# Patient Record
Sex: Female | Born: 1990 | Race: Black or African American | Hispanic: No | Marital: Single | Smoking: Never smoker
Health system: Southern US, Community
[De-identification: ages and names within clinical notes are randomized; demographics above are authoritative.]

## PROBLEM LIST (undated history)

## (undated) DIAGNOSIS — F419 Anxiety disorder, unspecified: Secondary | ICD-10-CM

## (undated) DIAGNOSIS — J45909 Unspecified asthma, uncomplicated: Secondary | ICD-10-CM

## (undated) DIAGNOSIS — D649 Anemia, unspecified: Secondary | ICD-10-CM

## (undated) DIAGNOSIS — G43909 Migraine, unspecified, not intractable, without status migrainosus: Secondary | ICD-10-CM

## (undated) HISTORY — PX: TONSILLECTOMY: SUR1361

---
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
## 2011-02-27 ENCOUNTER — Emergency Department: Payer: Self-pay | Admitting: Emergency Medicine

## 2014-05-19 ENCOUNTER — Ambulatory Visit: Payer: Self-pay | Admitting: Urology

## 2015-04-17 ENCOUNTER — Other Ambulatory Visit: Payer: Self-pay | Admitting: Internal Medicine

## 2015-04-17 DIAGNOSIS — R1031 Right lower quadrant pain: Secondary | ICD-10-CM

## 2015-04-18 ENCOUNTER — Encounter: Payer: Self-pay | Admitting: Emergency Medicine

## 2015-04-18 ENCOUNTER — Emergency Department
Admission: EM | Admit: 2015-04-18 | Discharge: 2015-04-18 | Disposition: A | Discharge status: Home / Self Care | Payer: Medicaid Other | Attending: Emergency Medicine | Admitting: Emergency Medicine

## 2015-04-18 ENCOUNTER — Emergency Department: Payer: Medicaid Other

## 2015-04-18 DIAGNOSIS — Z3202 Encounter for pregnancy test, result negative: Secondary | ICD-10-CM | POA: Insufficient documentation

## 2015-04-18 DIAGNOSIS — R109 Unspecified abdominal pain: Secondary | ICD-10-CM | POA: Insufficient documentation

## 2015-04-18 HISTORY — DX: Anemia, unspecified: D64.9

## 2015-04-18 HISTORY — DX: Migraine, unspecified, not intractable, without status migrainosus: G43.909

## 2015-04-18 HISTORY — DX: Anxiety disorder, unspecified: F41.9

## 2015-04-18 HISTORY — DX: Unspecified asthma, uncomplicated: J45.909

## 2015-04-18 LAB — URINALYSIS COMPLETE WITH MICROSCOPIC (ARMC ONLY)
BILIRUBIN URINE: NEGATIVE
Glucose, UA: NEGATIVE mg/dL
Hgb urine dipstick: NEGATIVE
Ketones, ur: NEGATIVE mg/dL
NITRITE: NEGATIVE
Protein, ur: NEGATIVE mg/dL
SPECIFIC GRAVITY, URINE: 1.019 (ref 1.005–1.030)
pH: 6 (ref 5.0–8.0)

## 2015-04-18 LAB — CBC WITH DIFFERENTIAL/PLATELET
Basophils Absolute: 0.1 10*3/uL (ref 0–0.1)
Basophils Relative: 1 %
Eosinophils Absolute: 0.2 10*3/uL (ref 0–0.7)
Eosinophils Relative: 2 %
HCT: 40.7 % (ref 35.0–47.0)
Hemoglobin: 12.7 g/dL (ref 12.0–16.0)
LYMPHS ABS: 4 10*3/uL — AB (ref 1.0–3.6)
LYMPHS PCT: 44 %
MCH: 21.4 pg — ABNORMAL LOW (ref 26.0–34.0)
MCHC: 31.1 g/dL — AB (ref 32.0–36.0)
MCV: 68.6 fL — ABNORMAL LOW (ref 80.0–100.0)
Monocytes Absolute: 0.5 10*3/uL (ref 0.2–0.9)
Monocytes Relative: 5 %
NEUTROS PCT: 48 %
Neutro Abs: 4.4 10*3/uL (ref 1.4–6.5)
Platelets: 261 10*3/uL (ref 150–440)
RBC: 5.93 MIL/uL — ABNORMAL HIGH (ref 3.80–5.20)
RDW: 18.7 % — ABNORMAL HIGH (ref 11.5–14.5)
WBC: 9.1 10*3/uL (ref 3.6–11.0)

## 2015-04-18 LAB — POCT PREGNANCY, URINE: Preg Test, Ur: NEGATIVE

## 2015-04-18 LAB — COMPREHENSIVE METABOLIC PANEL
ALT: 13 U/L — AB (ref 14–54)
ANION GAP: 9 (ref 5–15)
AST: 20 U/L (ref 15–41)
Albumin: 3.6 g/dL (ref 3.5–5.0)
Alkaline Phosphatase: 71 U/L (ref 38–126)
BILIRUBIN TOTAL: 0.6 mg/dL (ref 0.3–1.2)
BUN: 8 mg/dL (ref 6–20)
CHLORIDE: 107 mmol/L (ref 101–111)
CO2: 23 mmol/L (ref 22–32)
Calcium: 8.9 mg/dL (ref 8.9–10.3)
Creatinine, Ser: 0.86 mg/dL (ref 0.44–1.00)
GFR calc Af Amer: >60 mL/min (ref >60)
GFR calc non Af Amer: >60 mL/min (ref >60)
GLUCOSE: 107 mg/dL — AB (ref 65–99)
POTASSIUM: 3.9 mmol/L (ref 3.5–5.1)
SODIUM: 139 mmol/L (ref 135–145)
Total Protein: 7.2 g/dL (ref 6.5–8.1)

## 2015-04-18 NOTE — ED Provider Notes (Signed)
[facility] Emergency Department Provider Note    ____________________________________________  Time seen: 1805  I have reviewed the triage vital signs and the nursing notes.   HISTORY  Chief Complaint Abdominal Pain   History limited by: Not Limited   HPI Denise Duffy is a 24 y.o. female who presents to the emergency department because of right sided abdominal pain. Patient has been having this pain for roughly 1.5 months. She has seen her primary care doctor for this pain. She comes in today because pain has gotten worse. She has not noticed any bloody stools although she has had diarrhea. She has had some vomiting. This is nonbloody. Patient denies any fevers. States primary care doctor has scheduled her for an outpatient CT scan. Denies any urinary symptoms.     Past Medical History  Diagnosis Date  . Asthma   . Anxiety   . Migraine   . Anemia     There are no active problems to display for this patient.   Past Surgical History  Procedure Laterality Date  . Tonsillectomy      No current outpatient prescriptions on file.  Allergies Review of patient's allergies indicates no known allergies.  Family History  Problem Relation Age of Onset  . Diabetes Mother   . Hypertension Mother   . GER disease Mother   . Bipolar disorder Mother   . Arthritis Mother   . Diabetes Father   . Hypertension Father     Social History History  Substance Use Topics  . Smoking status: Never Smoker   . Smokeless tobacco: Not on file  . Alcohol Use: No    Review of Systems  Constitutional: Negative for fever. Cardiovascular: Negative for chest pain. Respiratory: Negative for shortness of breath. Gastrointestinal: Positive for right-sided abdominal pain  Genitourinary: Negative for dysuria. Musculoskeletal: Negative for back pain. Skin: Negative for rash. Neurological: Negative for headaches, focal weakness or numbness.   10-point ROS  otherwise negative.  ____________________________________________   PHYSICAL EXAM:  VITAL SIGNS: ED Triage Vitals  Enc Vitals Group     BP 04/18/15 1342 118/60 mmHg     Pulse Rate 04/18/15 1342 90     Resp --      Temp 04/18/15 1342 98.1 F (36.7 C)     Temp Source 04/18/15 1342 Oral     SpO2 04/18/15 1342 97 %     Weight 04/18/15 1342 274 lb (124.286 kg)     Height 04/18/15 1342 5\' 4"  (1.626 m)     Head Cir --      Peak Flow --      Pain Score 04/18/15 1345 7   Constitutional: Alert and oriented. Well appearing and in no distress. Eyes: Conjunctivae are normal. PERRL. Normal extraocular movements. ENT   Head: Normocephalic and atraumatic.   Nose: No congestion/rhinnorhea.   Mouth/Throat: Mucous membranes are moist.   Neck: No stridor. Hematological/Lymphatic/Immunilogical: No cervical lymphadenopathy. Cardiovascular: Normal rate, regular rhythm.  No murmurs, rubs, or gallops. Respiratory: Normal respiratory effort without tachypnea nor retractions. Breath sounds are clear and equal bilaterally. No wheezes/rales/rhonchi. Gastrointestinal: Soft and minimally tender to the right upper quadrant. No rebound. No guarding. Genitourinary: Deferred Musculoskeletal: Normal range of motion in all extremities. No joint effusions.  No lower extremity tenderness nor edema. Neurologic:  Normal speech and language. No gross focal neurologic deficits are appreciated. Speech is normal.  Skin:  Skin is warm, dry and intact. No rash noted. Psychiatric: Mood and affect are normal.  Speech and behavior are normal. Patient exhibits appropriate insight and judgment.  ____________________________________________    LABS (pertinent positives/negatives)  Labs Reviewed  CBC WITH DIFFERENTIAL/PLATELET - Abnormal; Notable for the following:    RBC 5.93 (*)    MCV 68.6 (*)    MCH 21.4 (*)    MCHC 31.1 (*)    RDW 18.7 (*)    Lymphs Abs 4.0 (*)    All other components within normal  limits  COMPREHENSIVE METABOLIC PANEL - Abnormal; Notable for the following:    Glucose, Bld 107 (*)    ALT 13 (*)    All other components within normal limits  URINALYSIS COMPLETEWITH MICROSCOPIC ([facility] ONLY) - Abnormal; Notable for the following:    Color, Urine YELLOW (*)    APPearance HAZY (*)    Leukocytes, UA TRACE (*)    Bacteria, UA MANY (*)    Squamous Epithelial / LPF 6-30 (*)    All other components within normal limits  POC URINE PREG, ED  POCT PREGNANCY, URINE     ____________________________________________   EKG  None  ____________________________________________    RADIOLOGY  Right upper quadrant ultrasound IMPRESSION: Unremarkable ultrasound of the right upper quadrant. ____________________________________________   PROCEDURES  Procedure(s) performed: None  Critical Care performed: No  ____________________________________________   INITIAL IMPRESSION / ASSESSMENT AND PLAN / ED COURSE  Pertinent labs & imaging results that were available during my care of the patient were reviewed by me and considered in my medical decision making (see chart for details).  Patient presents to the emergency department for right-sided abdominal pain for 1.5 months. On exam patient landed tender in the right upper quadrant. Blood work without any leukocytosis. Right upper quadrant ultrasound without any concerning findings. At this point discussed with patient encourage continued primary care evaluation workup. Did not think patient required emergent CT scanning at this point given normal vital signs and lab work.  ____________________________________________   FINAL CLINICAL IMPRESSION(S) / ED DIAGNOSES  Final diagnoses:  Abdominal pain, unspecified abdominal location     Phineas Semen, MD 04/18/15 2016

## 2015-04-18 NOTE — Discharge Instructions (Signed)
Please seek medical attention for any high fevers, chest pain, shortness of breath, change in behavior, persistent vomiting, bloody stool or any other new or concerning symptoms. ° °Abdominal Pain °Many things can cause abdominal pain. Usually, abdominal pain is not caused by a disease and will improve without treatment. It can often be observed and treated at home. Your health care provider will do a physical exam and possibly order blood tests and X-rays to help determine the seriousness of your pain. However, in many cases, more time must pass before a clear cause of the pain can be found. Before that point, your health care provider may not know if you need more testing or further treatment. °HOME CARE INSTRUCTIONS  °Monitor your abdominal pain for any changes. The following actions may help to alleviate any discomfort you are experiencing: °· Only take over-the-counter or prescription medicines as directed by your health care provider. °· Do not take laxatives unless directed to do so by your health care provider. °· Try a clear liquid diet (broth, tea, or water) as directed by your health care provider. Slowly move to a bland diet as tolerated. °SEEK MEDICAL CARE IF: °· You have unexplained abdominal pain. °· You have abdominal pain associated with nausea or diarrhea. °· You have pain when you urinate or have a bowel movement. °· You experience abdominal pain that wakes you in the night. °· You have abdominal pain that is worsened or improved by eating food. °· You have abdominal pain that is worsened with eating fatty foods. °· You have a fever. °SEEK IMMEDIATE MEDICAL CARE IF:  °· Your pain does not go away within 2 hours. °· You keep throwing up (vomiting). °· Your pain is felt only in portions of the abdomen, such as the right side or the left lower portion of the abdomen. °· You pass bloody or black tarry stools. °MAKE SURE YOU: °· Understand these instructions.   °· Will watch your condition.   °· Will  get help right away if you are not doing well or get worse.   °Document Released: 08/10/2005 Document Revised: 11/05/2013 Document Reviewed: 07/10/2013 °Exit[employer]® Patient Information ©2015 [employer]. This information is not intended to replace advice given to you by your health care provider. Make sure you discuss any questions you have with your health care provider. ° °

## 2015-04-18 NOTE — ED Notes (Signed)
Pt to ed with c/o right side abd pain x 1 1/2 months,  Pt states progressively worse over the past 1 1/2 months.  Pt also reports n/v/d x 2 weeks.

## 2015-04-18 NOTE — ED Notes (Signed)
Pt alert and in NAd at time of d/c. Mother agitated with d/c due to "being here for 7 hours aint no one been in here." MD in room at this time.

## 2015-04-23 ENCOUNTER — Ambulatory Visit: Payer: Self-pay

## 2015-04-27 ENCOUNTER — Ambulatory Visit: Admission: RE | Admit: 2015-04-27 | Payer: Medicaid Other | Source: Ambulatory Visit

## 2015-04-29 ENCOUNTER — Ambulatory Visit
Admission: RE | Admit: 2015-04-29 | Discharge: 2015-04-29 | Disposition: A | Discharge status: Home / Self Care | Payer: Medicaid Other | Source: Ambulatory Visit | Attending: Internal Medicine | Admitting: Internal Medicine

## 2015-04-29 DIAGNOSIS — R1031 Right lower quadrant pain: Secondary | ICD-10-CM | POA: Diagnosis not present

## 2015-04-29 MED ORDER — IOHEXOL 350 MG/ML SOLN
100.0000 mL | Freq: Once | INTRAVENOUS | Status: AC | PRN
  Administered 2015-04-29: 100 mL via INTRAVENOUS

## 2016-06-29 ENCOUNTER — Other Ambulatory Visit: Payer: Self-pay | Admitting: Neurology

## 2016-06-29 DIAGNOSIS — R51 Headache: Secondary | ICD-10-CM

## 2016-06-29 DIAGNOSIS — R519 Headache, unspecified: Secondary | ICD-10-CM

## 2016-06-29 DIAGNOSIS — H4710 Unspecified papilledema: Secondary | ICD-10-CM

## 2016-08-09 ENCOUNTER — Ambulatory Visit
Admission: RE | Admit: 2016-08-09 | Discharge: 2016-08-09 | Disposition: A | Discharge status: Home / Self Care | Payer: Medicaid Other | Source: Ambulatory Visit | Attending: Neurology | Admitting: Neurology

## 2016-08-09 DIAGNOSIS — H4710 Unspecified papilledema: Secondary | ICD-10-CM | POA: Insufficient documentation

## 2016-08-09 DIAGNOSIS — R51 Headache: Secondary | ICD-10-CM | POA: Diagnosis not present

## 2016-08-09 DIAGNOSIS — R519 Headache, unspecified: Secondary | ICD-10-CM

## 2016-08-09 DIAGNOSIS — R9089 Other abnormal findings on diagnostic imaging of central nervous system: Secondary | ICD-10-CM | POA: Insufficient documentation

## 2016-08-11 ENCOUNTER — Other Ambulatory Visit: Payer: Self-pay | Admitting: Neurology

## 2016-08-11 DIAGNOSIS — G932 Benign intracranial hypertension: Secondary | ICD-10-CM

## 2016-08-26 ENCOUNTER — Ambulatory Visit: Admission: RE | Admit: 2016-08-26 | Payer: Medicaid Other | Source: Ambulatory Visit

## 2016-09-08 ENCOUNTER — Ambulatory Visit
Admission: RE | Admit: 2016-09-08 | Discharge: 2016-09-08 | Disposition: A | Discharge status: Home / Self Care | Payer: Medicaid Other | Source: Ambulatory Visit | Attending: Neurology | Admitting: Neurology

## 2016-09-08 DIAGNOSIS — G932 Benign intracranial hypertension: Secondary | ICD-10-CM | POA: Diagnosis not present

## 2016-09-08 LAB — HCG, QUANTITATIVE, PREGNANCY: hCG, Beta Chain, Quant, S: <1 m[IU]/mL (ref <5)

## 2016-09-08 MED ORDER — LIDOCAINE HCL (PF) 1 % IJ SOLN
5.0000 mL | Freq: Once | INTRAMUSCULAR | Status: AC
  Administered 2016-09-08: 5 mL
  Filled 2016-09-08: qty 5

## 2016-09-08 NOTE — Discharge Instructions (Signed)
Lumbar Puncture, Care After °Refer to this sheet in the next few weeks. These instructions provide you with information on caring for yourself after your procedure. Your health care provider may also give you more specific instructions. Your treatment has been planned according to current medical practices, but problems sometimes occur. Call your health care provider if you have any problems or questions after your procedure. °WHAT TO EXPECT AFTER THE PROCEDURE °After your procedure, it is typical to have the following sensations: °· Mild discomfort or pain at the insertion site. °· Mild headache that is relieved with pain medicines. °HOME CARE INSTRUCTIONS °· Avoid lifting anything heavier than 10 lb (4.5 kg) for at least 12 hours after the procedure. °· Drink enough fluids to keep your urine clear or pale yellow. °SEEK MEDICAL CARE IF: °· You have fever or chills. °· You have nausea or vomiting. °· You have a headache that lasts for more than 2 days. °SEEK IMMEDIATE MEDICAL CARE IF: °· You have any numbness or tingling in your legs. °· You are unable to control your bowel or bladder. °· You have bleeding or swelling in your back at the insertion site. °· You are dizzy or faint. °  °This information is not intended to replace advice given to you by your health care provider. Make sure you discuss any questions you have with your health care provider. °  °Document Released: 11/05/2013 Document Reviewed: 11/05/2013 °Elsevier Interactive Patient Education ©2016 [employer] ° °

## 2016-09-08 NOTE — Procedures (Signed)
Procedure and associated risks explained to pt and questions answered.  Written witnessed consent obtained.  L2-3 LP performed under sterile technique with 1% xylocaine using 22g spinal needle.   OP 218 mm water 5.5 cc clear CSF collected (not sent for labs and confirmed with Dr. Daisy BlossomPotter's office) CP  162 mm water  No immediate complications.  Post procedure instructions reviewed in detail.

## 2016-09-08 NOTE — Progress Notes (Signed)
Pt has remained clinically stable post lumbar puncture,vitals have remained stable denies complaints of pain nor headache, parents present, up and tolerating well, instructions for discharge given with questions answered.ready for discharge at this time.

## 2017-05-30 IMAGING — US US ABDOMEN LIMITED
1 series · 14 of 25 positions shown · non-contrast
Comparison: None.

CLINICAL DATA: Chronic right upper quadrant abdominal pain, with
subacute onset of nausea, vomiting and diarrhea. Initial encounter.

EXAM:
US ABDOMEN LIMITED - RIGHT UPPER QUADRANT

[Series 1: us abdomen limited · 0.24mm/px · 14 of 48 slices shown]
[im 1/48]
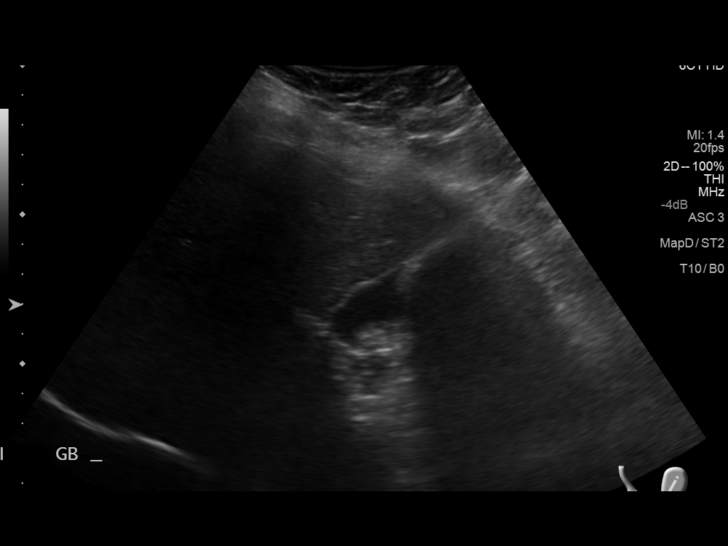
[im 4/48]
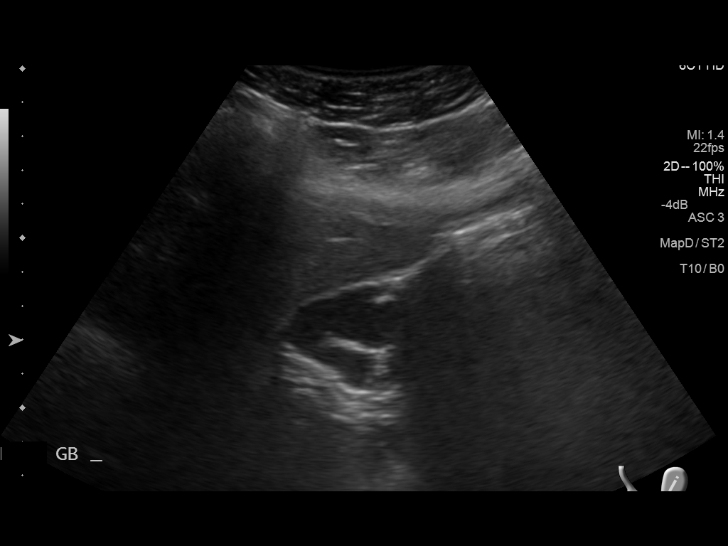
[im 8/48]
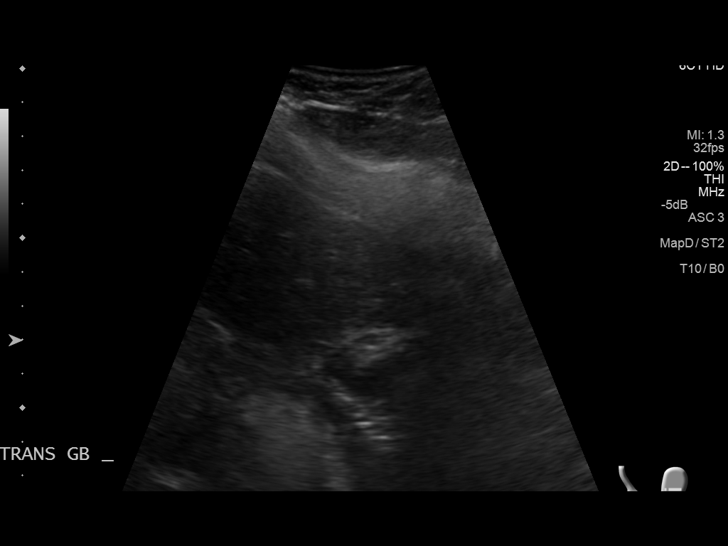
[im 12/48]
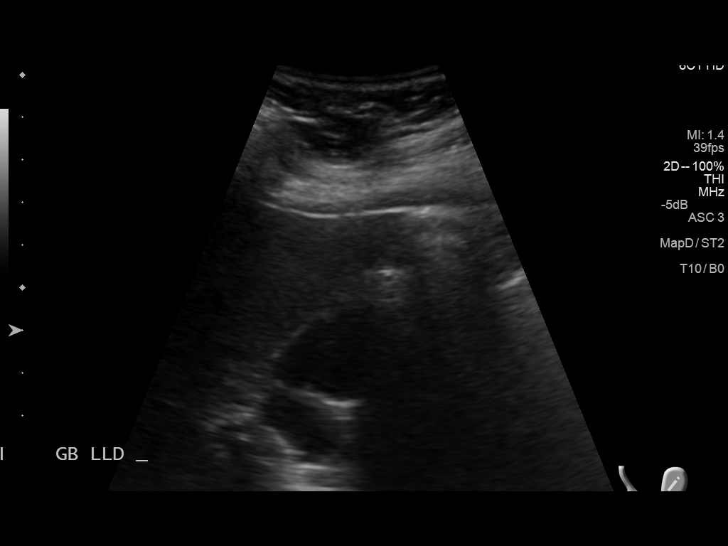
[im 16/48]
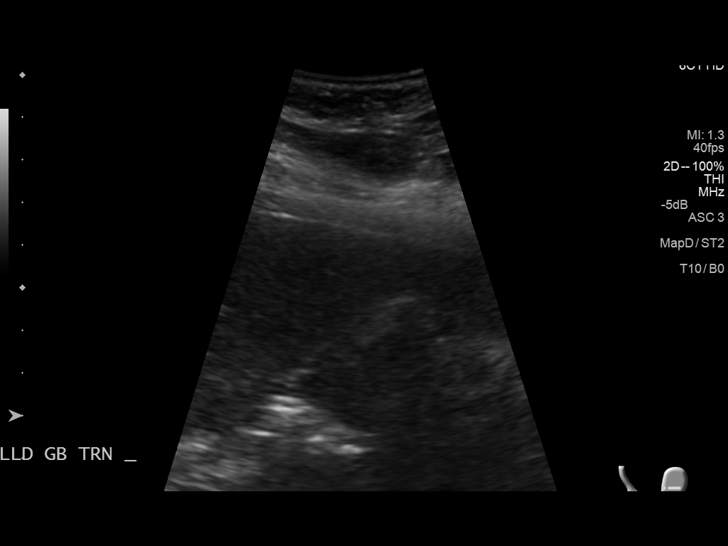
[im 18/48]
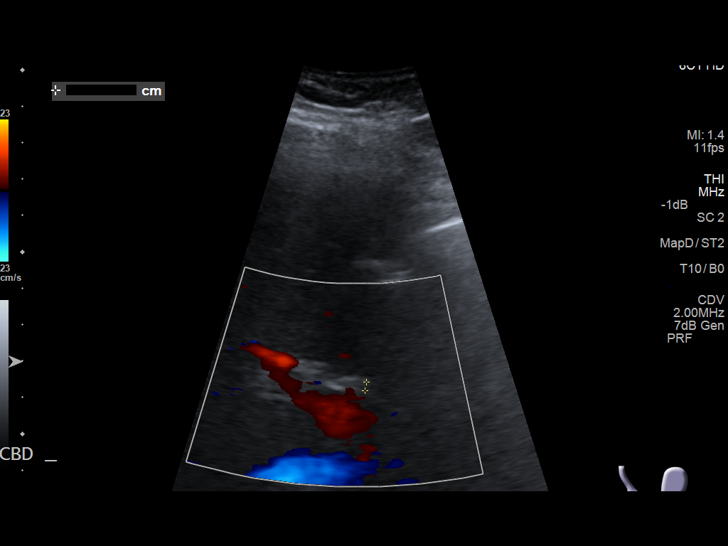
[im 22/48]
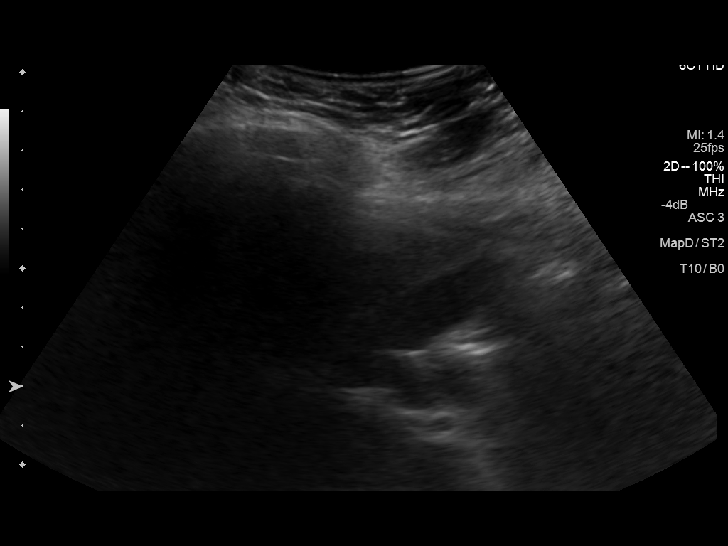
[im 26/48]
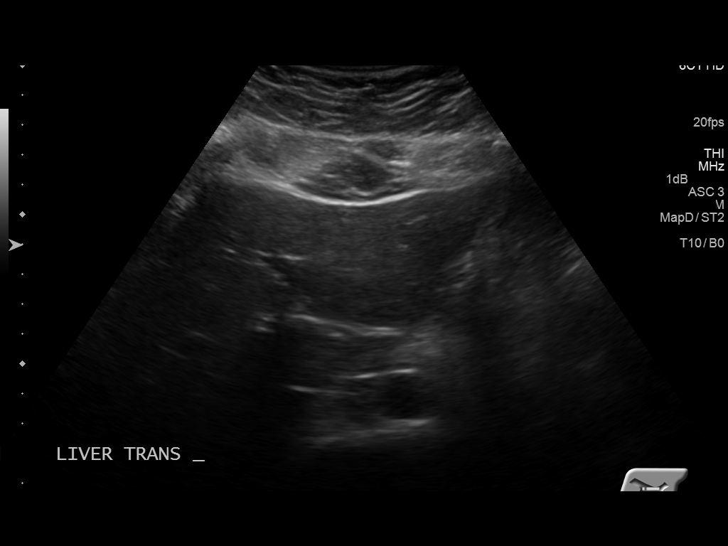
[im 30/48]
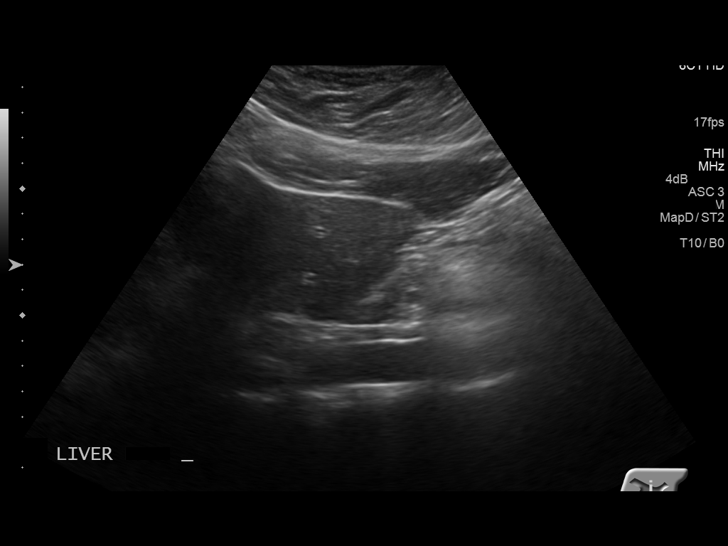
[im 32/48]
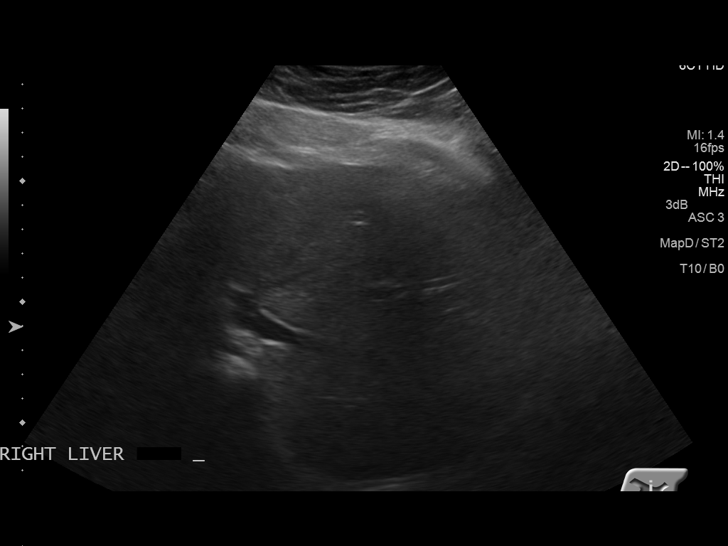
[im 36/48]
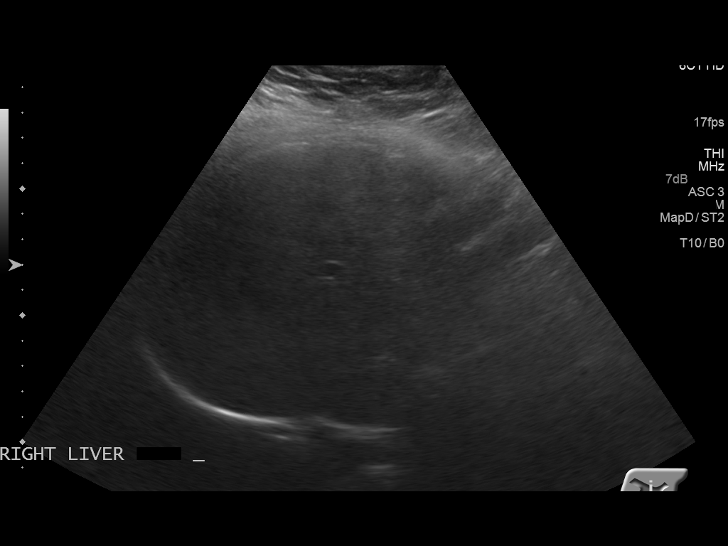
[im 40/48]
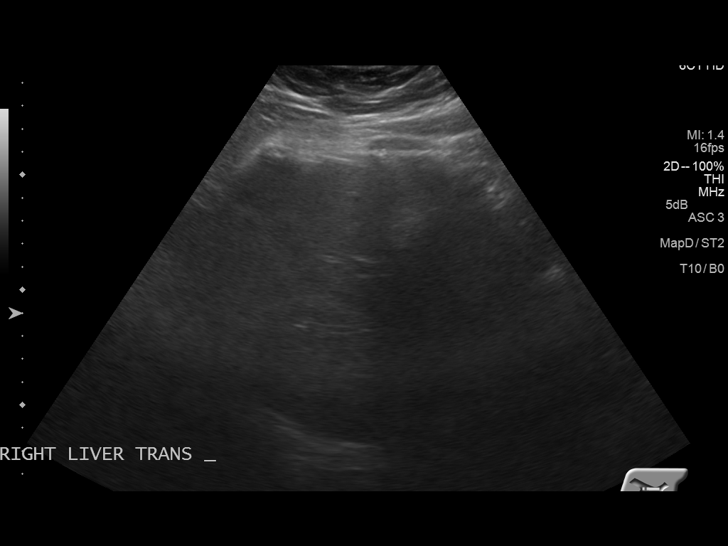
[im 44/48]
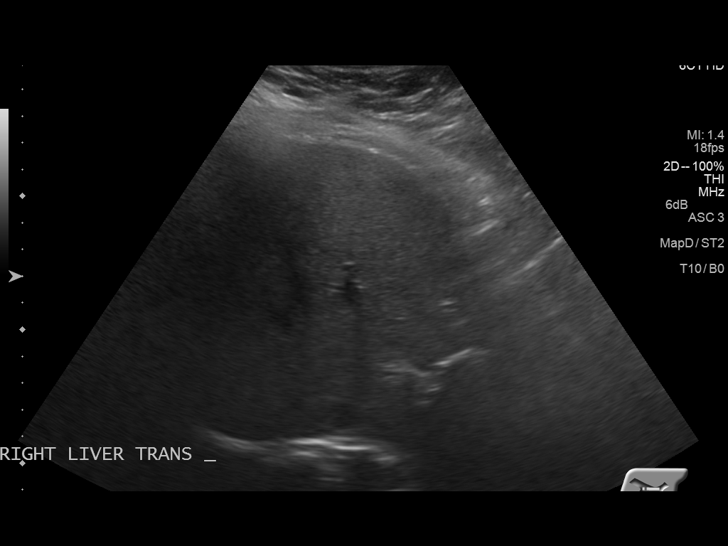
[im 48/48]
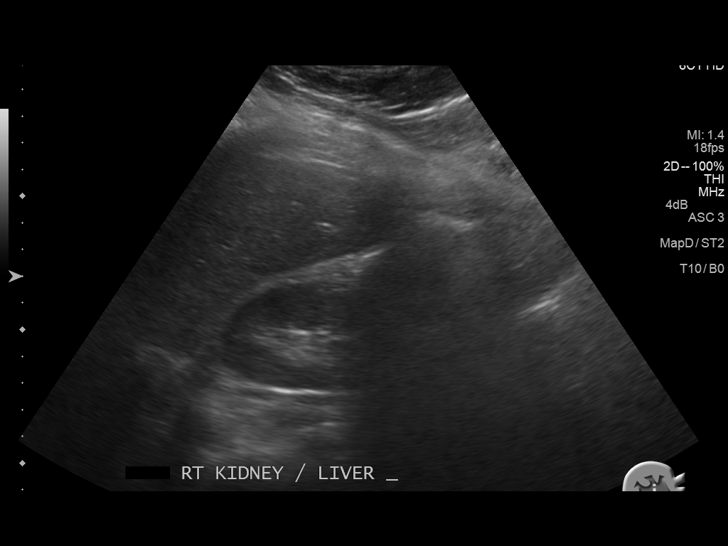

[14 of 25 positions shown; findings below may reference images not displayed]

FINDINGS: Gallbladder:

No gallstones or wall thickening visualized. The gallbladder fundus
is difficult to fully assess, but appears normal following
repositioning. No sonographic Murphy sign noted.

Common bile duct:

Diameter: 0.2 cm within normal limits in caliber.

Liver:

No focal lesion identified. Within normal limits in parenchymal
echogenicity.
IMPRESSION: Unremarkable ultrasound of the right upper quadrant.

## 2020-03-16 ENCOUNTER — Other Ambulatory Visit: Payer: Self-pay

## 2020-03-16 ENCOUNTER — Emergency Department: Payer: Medicaid Other

## 2020-03-16 ENCOUNTER — Encounter: Payer: Self-pay | Admitting: *Deleted

## 2020-03-16 ENCOUNTER — Emergency Department
Admission: EM | Admit: 2020-03-16 | Discharge: 2020-03-16 | Disposition: A | Discharge status: Home / Self Care | Payer: Medicaid Other | Attending: Emergency Medicine | Admitting: Emergency Medicine

## 2020-03-16 DIAGNOSIS — M545 Low back pain, unspecified: Secondary | ICD-10-CM

## 2020-03-16 LAB — URINALYSIS, COMPLETE (UACMP) WITH MICROSCOPIC
Bacteria, UA: NONE SEEN
Bilirubin Urine: NEGATIVE
Glucose, UA: NEGATIVE mg/dL
Hgb urine dipstick: NEGATIVE
Ketones, ur: NEGATIVE mg/dL
Leukocytes,Ua: NEGATIVE
Nitrite: NEGATIVE
Protein, ur: 30 mg/dL — AB
Specific Gravity, Urine: 1.029 (ref 1.005–1.030)
pH: 5 (ref 5.0–8.0)

## 2020-03-16 LAB — POCT PREGNANCY, URINE: Preg Test, Ur: NEGATIVE

## 2020-03-16 MED ORDER — OXYCODONE-ACETAMINOPHEN 5-325 MG PO TABS
1.0000 | ORAL_TABLET | Freq: Once | ORAL | Status: AC
  Administered 2020-03-16: 22:00:00 1 via ORAL
  Filled 2020-03-16: qty 1

## 2020-03-16 MED ORDER — PREDNISONE 10 MG (21) PO TBPK: ORAL_TABLET | ORAL | 0 refills | Status: DC

## 2020-03-16 MED ORDER — CYCLOBENZAPRINE HCL 10 MG PO TABS: 10.0000 mg | ORAL_TABLET | Freq: Three times a day (TID) | ORAL | 0 refills | Status: DC | PRN

## 2020-03-16 MED ORDER — LIDOCAINE 5 % EX PTCH
1.0000 | MEDICATED_PATCH | CUTANEOUS | Status: DC
  Administered 2020-03-16: 23:00:00 1 via TRANSDERMAL
  Filled 2020-03-16: qty 1

## 2020-03-16 MED ORDER — TRAMADOL HCL 50 MG PO TABS: 50.0000 mg | ORAL_TABLET | Freq: Four times a day (QID) | ORAL | 0 refills | Status: DC | PRN

## 2020-03-16 NOTE — ED Triage Notes (Signed)
Pt has lower back pain for several months.  No known injury.  No urinary sx.  Pt alert

## 2020-03-16 NOTE — ED Notes (Signed)
Pt at Xray at this time 

## 2020-03-16 NOTE — ED Provider Notes (Signed)
[facility] Emergency Department Provider Note  ____________________________________________   First MD Initiated Contact with Patient 03/16/20 2059     (approximate)  I have reviewed the triage vital signs and the nursing notes.   HISTORY  Chief Complaint Back Pain    HPI Denise Duffy is a 29 y.o. female presents emergency department with her guardian.   She is complaining of low back pain and itching.  Patient's mother who is her legal guardian states she had a Nexplanon taken out a few months ago has had itching and changes in her body since.  She is complained of low back pain for a while.  Felt it pop today they decided to come to the emergency department.  She is also been evaluated by her regular doctor for back pain.  She denies any fever or chills.  No vaginal discharge.  Patient is not sexually active.   Past Medical History:  Diagnosis Date  . Anemia   . Anxiety   . Asthma   . Migraine     There are no problems to display for this patient.   Past Surgical History:  Procedure Laterality Date  . TONSILLECTOMY      Prior to Admission medications   Medication Sig Start Date End Date Taking? Authorizing Provider  cyclobenzaprine (FLEXERIL) 10 MG tablet Take 1 tablet (10 mg total) by mouth 3 (three) times daily as needed. 03/16/20   Nimrod Wendt, Linden Dolin, PA-C  predniSONE (STERAPRED UNI-PAK 21 TAB) 10 MG (21) TBPK tablet Take 6 pills on day one then decrease by 1 pill each day 03/16/20   Versie Starks, PA-C  traMADol (ULTRAM) 50 MG tablet Take 1 tablet (50 mg total) by mouth every 6 (six) hours as needed. 03/16/20   Versie Starks, PA-C    Allergies Patient has no known allergies.  Family History  Problem Relation Age of Onset  . Diabetes Mother   . Hypertension Mother   . GER disease Mother   . Bipolar disorder Mother   . Arthritis Mother   . Diabetes Father   . Hypertension Father     Social History Social History   Tobacco  Use  . Smoking status: Never Smoker  . Smokeless tobacco: Never Used  Substance Use Topics  . Alcohol use: No  . Drug use: No    Review of Systems  Constitutional: No fever/chills Eyes: No visual changes. ENT: No sore throat. Respiratory: Denies cough Gastrointestinal: Denies abdominal pain Genitourinary: Negative for dysuria. Musculoskeletal: Positive for back pain. Skin: Negative for rash. Psychiatric: no mood changes,     ____________________________________________   PHYSICAL EXAM:  VITAL SIGNS: ED Triage Vitals  Enc Vitals Group     BP 03/16/20 1939 138/76     Pulse Rate 03/16/20 1939 98     Resp 03/16/20 1939 18     Temp 03/16/20 1939 98.1 F (36.7 C)     Temp Source 03/16/20 1939 Oral     SpO2 03/16/20 1939 96 %     Weight 03/16/20 1942 (!) 310 lb (140.6 kg)     Height 03/16/20 1942 5\' 3"  (1.6 m)     Head Circumference --      Peak Flow --      Pain Score 03/16/20 1942 6     Pain Loc --      Pain Edu? --      Excl. in [place]? --     Constitutional: Alert and oriented. Well appearing  and in no acute distress. Eyes: Conjunctivae are normal.  Head: Atraumatic. Nose: No congestion/rhinnorhea. Mouth/Throat: Mucous membranes are moist.   Neck:  supple no lymphadenopathy noted Cardiovascular: Normal rate, regular rhythm.  Respiratory: Normal respiratory effort.  No retractions Abd: soft nontender bs normal all 4 quad GU: deferred Musculoskeletal: Patient has difficulty moving from sitting to standing.  Lumbar spines tender to palpation.  No foot drop is noted.  Neurovascular is intact.  She is able to ambulate to the bathroom after standing.   Neurologic:  Normal speech and language.  Skin:  Skin is warm, dry and intact. No rash noted. Psychiatric: Mood and affect are normal. Speech and behavior are normal.  ____________________________________________   LABS (all labs ordered are listed, but only abnormal results are displayed)  Labs Reviewed   URINALYSIS, COMPLETE (UACMP) WITH MICROSCOPIC - Abnormal; Notable for the following components:      Result Value   Color, Urine YELLOW (*)    APPearance HAZY (*)    Protein, ur 30 (*)    All other components within normal limits  POC URINE PREG, ED  POCT PREGNANCY, URINE   ____________________________________________   ____________________________________________  RADIOLOGY  X-ray of the lumbar spine does not show any acute abnormalities  ____________________________________________   PROCEDURES  Procedure(s) performed: Percocet 1 p.o.   Procedures    ____________________________________________   INITIAL IMPRESSION / ASSESSMENT AND PLAN / ED COURSE  Pertinent labs & imaging results that were available during my care of the patient were reviewed by me and considered in my medical decision making (see chart for details).   Patient is 29 year old female presents emergency department with her legal guardian who is her mother.  Complaining of low back pain.  See HPI  Physical exam shows patient to appear well.  Lumbar spines mildly tender.  Patient has difficulty going from sitting to standing but is able to ambulate without difficulty.  Also no rash/hives are noted on patient's body.  X-ray of the lumbar spine is negative  Patient was given a Percocet while here in the ED.  She states that the pain medication has helped with her pain.  I explained the x-ray results to her and her mother.  We did discuss back pain and follow-up with the correct position if needed.  She is to follow-up with her regular doctor if not improving or she can follow-up with [facility] orthopedics.  They state they understand will comply.  She is to apply ice to the lower back.  Is given prescription for prednisone, Flexeril, and tramadol.     Denise Duffy was evaluated in Emergency Department on 03/16/2020 for the symptoms described in the history of present illness. She was evaluated in  the context of the global COVID-19 pandemic, which necessitated consideration that the patient might be at risk for infection with the SARS-CoV-2 virus that causes COVID-19. Institutional protocols and algorithms that pertain to the evaluation of patients at risk for COVID-19 are in a state of rapid change based on information released by regulatory bodies including the CDC and federal and state organizations. These policies and algorithms were followed during the patient's care in the ED.   As part of my medical decision making, I reviewed the following data within the electronic MEDICAL RECORD NUMBER History obtained from family, Nursing notes reviewed and incorporated, Old chart reviewed, Radiograph reviewed , Notes from prior ED visits and [place] Controlled Substance Database  ____________________________________________   FINAL CLINICAL IMPRESSION(S) / ED DIAGNOSES  Final diagnoses:  Acute midline low back pain without sciatica      NEW MEDICATIONS STARTED DURING THIS VISIT:  New Prescriptions   CYCLOBENZAPRINE (FLEXERIL) 10 MG TABLET    Take 1 tablet (10 mg total) by mouth 3 (three) times daily as needed.   PREDNISONE (STERAPRED UNI-PAK 21 TAB) 10 MG (21) TBPK TABLET    Take 6 pills on day one then decrease by 1 pill each day   TRAMADOL (ULTRAM) 50 MG TABLET    Take 1 tablet (50 mg total) by mouth every 6 (six) hours as needed.     Note:  This document was prepared using Dragon voice recognition software and may include unintentional dictation errors.    Faythe Ghee, PA-C 03/16/20 2300    Sharyn Creamer, MD 03/17/20 [place] Manners

## 2020-03-16 NOTE — ED Notes (Signed)
Pt denies being able to provide urine sample at this time

## 2020-03-16 NOTE — ED Notes (Signed)
Legal guardian/mother at bedside

## 2020-03-16 NOTE — ED Notes (Signed)
See triage note, pt reports lower back pain since February. Pt states she felt her " back pop" earlier today. Used heating pad today with no relief.  Reports taking gabapentin prescribed by PCP for back pain.

## 2020-03-16 NOTE — Discharge Instructions (Addendum)
Follow-up with your regular doctor or Dr. Rosita Kea if not improving in 3 to 5 days.  Use medication as prescribed.  Apply ice to the lower back.  Return if worsening

## 2020-07-27 ENCOUNTER — Other Ambulatory Visit: Payer: Self-pay | Admitting: Neurology

## 2020-07-27 DIAGNOSIS — R519 Headache, unspecified: Secondary | ICD-10-CM

## 2020-07-27 DIAGNOSIS — H4710 Unspecified papilledema: Secondary | ICD-10-CM

## 2020-07-27 DIAGNOSIS — G932 Benign intracranial hypertension: Secondary | ICD-10-CM

## 2020-07-31 ENCOUNTER — Other Ambulatory Visit: Payer: Self-pay | Admitting: Neurology

## 2020-07-31 DIAGNOSIS — G932 Benign intracranial hypertension: Secondary | ICD-10-CM

## 2020-08-03 ENCOUNTER — Ambulatory Visit
Admission: RE | Admit: 2020-08-03 | Discharge: 2020-08-03 | Disposition: A | Discharge status: Home / Self Care | Payer: Medicaid Other | Source: Ambulatory Visit | Attending: Neurology | Admitting: Neurology

## 2020-08-03 ENCOUNTER — Ambulatory Visit: Payer: Medicaid Other

## 2020-08-18 ENCOUNTER — Other Ambulatory Visit: Payer: Self-pay

## 2020-08-18 ENCOUNTER — Ambulatory Visit
Admission: RE | Admit: 2020-08-18 | Discharge: 2020-08-18 | Disposition: A | Discharge status: Home / Self Care | Payer: Medicaid Other | Source: Ambulatory Visit | Attending: Neurology | Admitting: Neurology

## 2020-08-18 DIAGNOSIS — G932 Benign intracranial hypertension: Secondary | ICD-10-CM | POA: Insufficient documentation

## 2020-11-30 ENCOUNTER — Emergency Department: Payer: Medicaid Other

## 2020-11-30 ENCOUNTER — Other Ambulatory Visit: Payer: Self-pay

## 2020-11-30 DIAGNOSIS — J1282 Pneumonia due to coronavirus disease 2019: Secondary | ICD-10-CM | POA: Diagnosis not present

## 2020-11-30 DIAGNOSIS — U071 COVID-19: Secondary | ICD-10-CM | POA: Diagnosis not present

## 2020-11-30 DIAGNOSIS — J45909 Unspecified asthma, uncomplicated: Secondary | ICD-10-CM | POA: Diagnosis not present

## 2020-11-30 DIAGNOSIS — R059 Cough, unspecified: Secondary | ICD-10-CM | POA: Diagnosis present

## 2020-11-30 DIAGNOSIS — R000 Tachycardia, unspecified: Secondary | ICD-10-CM | POA: Insufficient documentation

## 2020-11-30 LAB — CBC WITH DIFFERENTIAL/PLATELET
Abs Immature Granulocytes: 0.02 10*3/uL (ref 0.00–0.07)
Basophils Absolute: 0 10*3/uL (ref 0.0–0.1)
Basophils Relative: 0 %
Eosinophils Absolute: 0 10*3/uL (ref 0.0–0.5)
Eosinophils Relative: 0 %
HCT: 47.2 % — ABNORMAL HIGH (ref 36.0–46.0)
Hemoglobin: 13.6 g/dL (ref 12.0–15.0)
Immature Granulocytes: 0 %
Lymphocytes Relative: 36 %
Lymphs Abs: 2.4 10*3/uL (ref 0.7–4.0)
MCH: 19.5 pg — ABNORMAL LOW (ref 26.0–34.0)
MCHC: 28.8 g/dL — ABNORMAL LOW (ref 30.0–36.0)
MCV: 67.5 fL — ABNORMAL LOW (ref 80.0–100.0)
Monocytes Absolute: 0.4 10*3/uL (ref 0.1–1.0)
Monocytes Relative: 7 %
Neutro Abs: 3.7 10*3/uL (ref 1.7–7.7)
Neutrophils Relative %: 57 %
Platelets: 293 10*3/uL (ref 150–400)
RBC: 6.99 MIL/uL — ABNORMAL HIGH (ref 3.87–5.11)
RDW: 22.1 % — ABNORMAL HIGH (ref 11.5–15.5)
Smear Review: NORMAL
WBC: 6.6 10*3/uL (ref 4.0–10.5)
nRBC: 0 % (ref 0.0–0.2)

## 2020-11-30 LAB — LACTIC ACID, PLASMA: Lactic Acid, Venous: 1.8 mmol/L (ref 0.5–1.9)

## 2020-11-30 LAB — COMPREHENSIVE METABOLIC PANEL
ALT: 16 U/L (ref 0–44)
AST: 27 U/L (ref 15–41)
Albumin: 3.4 g/dL — ABNORMAL LOW (ref 3.5–5.0)
Alkaline Phosphatase: 86 U/L (ref 38–126)
Anion gap: 7 (ref 5–15)
BUN: 8 mg/dL (ref 6–20)
CO2: 25 mmol/L (ref 22–32)
Calcium: 8.1 mg/dL — ABNORMAL LOW (ref 8.9–10.3)
Chloride: 104 mmol/L (ref 98–111)
Creatinine, Ser: 1.04 mg/dL — ABNORMAL HIGH (ref 0.44–1.00)
GFR, Estimated: >60 mL/min (ref >60)
Glucose, Bld: 116 mg/dL — ABNORMAL HIGH (ref 70–99)
Potassium: 4.4 mmol/L (ref 3.5–5.1)
Sodium: 136 mmol/L (ref 135–145)
Total Bilirubin: 0.7 mg/dL (ref 0.3–1.2)
Total Protein: 7.9 g/dL (ref 6.5–8.1)

## 2020-11-30 LAB — TROPONIN I (HIGH SENSITIVITY)
Troponin I (High Sensitivity): 6 ng/L (ref <18)
Troponin I (High Sensitivity): 6 ng/L (ref <18)

## 2020-11-30 MED ORDER — ACETAMINOPHEN 325 MG PO TABS
650.0000 mg | ORAL_TABLET | Freq: Once | ORAL | Status: AC | PRN
  Administered 2020-11-30: 650 mg via ORAL
  Filled 2020-11-30: qty 2

## 2020-11-30 NOTE — ED Triage Notes (Signed)
Pt to ED via [facility] with c/o being Covid+, per EMS pt tested + on 1/11, 91% on RA, 95% on 2L. Per EMS pt cough/sob, chest tenderness with couch.   138/94 128 HR 100 Oral

## 2020-11-30 NOTE — ED Triage Notes (Signed)
Pt to ED via [facility] from home for chief complaint of chest pain, shob and COVID + 1/11 Speaking in complete sentences.  Denies pain at this time, reports intermittent centralizes cp Denies taking any meds for fever today

## 2020-11-30 NOTE — ED Notes (Signed)
Pt mother/legal guardian contacted by this RN and notified of pt arrival to ED

## 2020-12-01 ENCOUNTER — Emergency Department
Admission: EM | Admit: 2020-12-01 | Discharge: 2020-12-01 | Disposition: A | Discharge status: Home / Self Care | Payer: Medicaid Other | Attending: Emergency Medicine | Admitting: Emergency Medicine

## 2020-12-01 ENCOUNTER — Emergency Department: Payer: Medicaid Other

## 2020-12-01 ENCOUNTER — Encounter: Payer: Self-pay | Admitting: Radiology

## 2020-12-01 DIAGNOSIS — J1282 Pneumonia due to coronavirus disease 2019: Secondary | ICD-10-CM

## 2020-12-01 DIAGNOSIS — U071 COVID-19: Secondary | ICD-10-CM

## 2020-12-01 MED ORDER — SODIUM CHLORIDE 0.9 % IV BOLUS
1000.0000 mL | Freq: Once | INTRAVENOUS | Status: AC
  Administered 2020-12-01: 1000 mL via INTRAVENOUS

## 2020-12-01 MED ORDER — KETOROLAC TROMETHAMINE 30 MG/ML IJ SOLN
10.0000 mg | Freq: Once | INTRAMUSCULAR | Status: AC
  Administered 2020-12-01: 9.9 mg via INTRAVENOUS
  Filled 2020-12-01: qty 1

## 2020-12-01 MED ORDER — IOHEXOL 350 MG/ML SOLN
100.0000 mL | Freq: Once | INTRAVENOUS | Status: AC | PRN
  Administered 2020-12-01: 100 mL via INTRAVENOUS
  Filled 2020-12-01: qty 100

## 2020-12-01 MED ORDER — PREDNISONE 50 MG PO TABS: ORAL_TABLET | ORAL | 0 refills | Status: DC

## 2020-12-01 MED ORDER — ONDANSETRON HCL 4 MG/2ML IJ SOLN
4.0000 mg | Freq: Once | INTRAMUSCULAR | Status: AC
  Administered 2020-12-01: 4 mg via INTRAVENOUS
  Filled 2020-12-01: qty 2

## 2020-12-01 MED ORDER — HYDROCOD POLST-CPM POLST ER 10-8 MG/5ML PO SUER
5.0000 mL | Freq: Two times a day (BID) | ORAL | 0 refills | Status: DC
Start: 2020-12-01 — End: 2022-06-30

## 2020-12-01 MED ORDER — AZITHROMYCIN 500 MG PO TABS
500.0000 mg | ORAL_TABLET | Freq: Once | ORAL | Status: AC
  Administered 2020-12-01: 500 mg via ORAL
  Filled 2020-12-01: qty 1

## 2020-12-01 MED ORDER — PREDNISONE 20 MG PO TABS
50.0000 mg | ORAL_TABLET | Freq: Once | ORAL | Status: AC
  Administered 2020-12-01: 50 mg via ORAL
  Filled 2020-12-01: qty 3

## 2020-12-01 MED ORDER — AZITHROMYCIN 250 MG PO TABS: 250.0000 mg | ORAL_TABLET | Freq: Every day | ORAL | 0 refills | Status: DC

## 2020-12-01 NOTE — ED Notes (Signed)
This RN discussed discharge paperwork with patient and patient's mother in private area in lobby, pin-pad for e-signature is unavailable. Pt and mother verbalized understanding of instructions.

## 2020-12-01 NOTE — ED Provider Notes (Signed)
[facility] Emergency Department Provider Note   ____________________________________________   Event Date/Time   First MD Initiated Contact with Patient 12/01/20 [id number]     (approximate)  I have reviewed the triage vital signs and the nursing notes.   HISTORY  Chief Complaint Chest Pain and covid+    HPI Denise Duffy is a 30 y.o. female who presents to the ED from home with a chief complaint of cough, body aches, shortness of breath, chest tightness, posttussive emesis, occasional diarrhea.  Patient tested positive for COVID-19 on 11/24/2020.  She is unvaccinated against COVID-19.  Initially had fevers which she states has improved.  Continues with the above symptoms.  Denies abdominal pain, dysuria, dizziness. States she had a 5-day course of Prednisone last week which she does not feel like helped anything and has an Albuterol inhaler as well as nebulizer.     Past Medical History:  Diagnosis Date  . Anemia   . Anxiety   . Asthma   . Migraine     There are no problems to display for this patient.   Past Surgical History:  Procedure Laterality Date  . TONSILLECTOMY      Prior to Admission medications   Medication Sig Start Date End Date Taking? Authorizing Provider  azithromycin (ZITHROMAX) 250 MG tablet Take 1 tablet (250 mg total) by mouth daily. 12/01/20  Yes Irean Hong, MD  chlorpheniramine-HYDROcodone [facility] PENNKINETIC ER) 10-8 MG/5ML SUER Take 5 mLs by mouth 2 (two) times daily. 12/01/20  Yes Irean Hong, MD  predniSONE (DELTASONE) 50 MG tablet 1 tablet daily until finished 12/01/20  Yes Irean Hong, MD  cyclobenzaprine (FLEXERIL) 10 MG tablet Take 1 tablet (10 mg total) by mouth 3 (three) times daily as needed. 03/16/20   Fisher, Roselyn Bering, PA-C  traMADol (ULTRAM) 50 MG tablet Take 1 tablet (50 mg total) by mouth every 6 (six) hours as needed. 03/16/20   Faythe Ghee, PA-C    Allergies Patient has no known allergies.  Family  History  Problem Relation Age of Onset  . Diabetes Mother   . Hypertension Mother   . GER disease Mother   . Bipolar disorder Mother   . Arthritis Mother   . Diabetes Father   . Hypertension Father     Social History Social History   Tobacco Use  . Smoking status: Never Smoker  . Smokeless tobacco: Never Used  Substance Use Topics  . Alcohol use: No  . Drug use: No    Review of Systems  Constitutional: Positive for body aches.  No fever/chills Eyes: No visual changes. ENT: No sore throat. Cardiovascular: Positive for chest tightness.Marland Kitchen Respiratory: Positive for cough and shortness of breath. Gastrointestinal: No abdominal pain.  Positive for occasional vomiting and diarrhea.  No constipation. Genitourinary: Negative for dysuria. Musculoskeletal: Negative for back pain. Skin: Negative for rash. Neurological: Negative for headaches, focal weakness or numbness.   ____________________________________________   PHYSICAL EXAM:  VITAL SIGNS: ED Triage Vitals  Enc Vitals Group     BP 11/30/20 1541 103/67     Pulse Rate 11/30/20 1541 (!) 128     Resp 11/30/20 1541 (!) 22     Temp 11/30/20 1541 (!) 100.5 F (38.1 C)     Temp Source 11/30/20 1541 Oral     SpO2 11/30/20 1541 94 %     Weight 11/30/20 1543 (!) 320 lb (145.2 kg)     Height 11/30/20 1543 5\' 3"  (1.6 m)  Head Circumference --      Peak Flow --      Pain Score 11/30/20 1542 0     Pain Loc --      Pain Edu? --      Excl. in GC? --     Constitutional: Alert and oriented. Well appearing and in mild acute distress. Eyes: Conjunctivae are normal. PERRL. EOMI. Head: Atraumatic. Nose: Congestion/rhinnorhea. Mouth/Throat: Mucous membranes are mildly dry.   Neck: No stridor.   Cardiovascular: Tachycardic rate, regular rhythm. Grossly normal heart sounds.  Good peripheral circulation. Respiratory: Increased respiratory effort.  No retractions. Lungs mildly diminished bibasilarly. Gastrointestinal: Obese.   Soft and nontender to light or deep palpation. No distention. No abdominal bruits. No CVA tenderness. Musculoskeletal: No lower extremity tenderness nor edema.  No joint effusions. Neurologic:  Normal speech and language. No gross focal neurologic deficits are appreciated.  Skin:  Skin is warm, dry and intact. No rash noted.  No petechiae. Psychiatric: Mood and affect are normal. Speech and behavior are normal.  ____________________________________________   LABS (all labs ordered are listed, but only abnormal results are displayed)  Labs Reviewed  COMPREHENSIVE METABOLIC PANEL - Abnormal; Notable for the following components:      Result Value   Glucose, Bld 116 (*)    Creatinine, Ser 1.04 (*)    Calcium 8.1 (*)    Albumin 3.4 (*)    All other components within normal limits  CBC WITH DIFFERENTIAL/PLATELET - Abnormal; Notable for the following components:   RBC 6.99 (*)    HCT 47.2 (*)    MCV 67.5 (*)    MCH 19.5 (*)    MCHC 28.8 (*)    RDW 22.1 (*)    All other components within normal limits  LACTIC ACID, PLASMA  URINALYSIS, COMPLETE (UACMP) WITH MICROSCOPIC  POC URINE PREG, ED  TROPONIN I (HIGH SENSITIVITY)  TROPONIN I (HIGH SENSITIVITY)   ____________________________________________  EKG  ED ECG REPORT I, Daegan Arizmendi J, the attending physician, personally viewed and interpreted this ECG.   Date: 12/01/2020  EKG Time: 1541  Rate: 124  Rhythm: sinus tachycardia  Axis: Normal  Intervals:none  ST&T Change: Nonspecific  ____________________________________________  RADIOLOGY I, Leathia Farnell J, personally viewed and evaluated these images (plain radiographs) as part of my medical decision making, as well as reviewing the written report by the radiologist.  ED MD interpretation: COVID-19 pneumonia; no PE  Official radiology report(s): DG Chest 2 View  Result Date: 11/30/2020 CLINICAL DATA:  Shortness of breath COVID EXAM: CHEST - 2 VIEW COMPARISON:  None. FINDINGS:  Streaky bilateral lower lung opacity with patchy peripheral right chest opacity. No pleural effusion. Normal heart size. No pneumothorax IMPRESSION: Bilateral airspace opacities suspicious for bilateral pneumonia. Electronically Signed   By: Jasmine Pang M.D.   On: 11/30/2020 16:11   CT Angio Chest PE W/Cm &/Or Wo Cm  Result Date: 12/01/2020 CLINICAL DATA:  30 year old female with chest pain, shortness of breath. Positive for COVID-19 on January 11th. EXAM: CT ANGIOGRAPHY CHEST WITH CONTRAST TECHNIQUE: Multidetector CT imaging of the chest was performed using the standard protocol during bolus administration of intravenous contrast. Multiplanar CT image reconstructions and MIPs were obtained to evaluate the vascular anatomy. CONTRAST:  OMNIPAQUE IOHEXOL 350 MG/ML SOLN COMPARISON:  Chest radiographs 11/30/2020. CT Abdomen and Pelvis 04/29/2015. FINDINGS: Cardiovascular: Suboptimal contrast bolus timing in the pulmonary arterial tree. And there is superimposed respiratory motion artifact which degrades lower lobe branch detail in particular. There is no central  or hilar pulmonary artery filling defect identified. Lower lung segmental branches are not well evaluated, a especially in the middle lobes. But in the visible branches there is no convincing pulmonary artery thrombus. Negative visible aorta. Cardiac size within normal limits. No pericardial effusion. Mediastinum/Nodes: Small reactive appearing hilar lymph nodes more so the right. Hypodense right thyroid nodule estimated at 2.3 cm. Recommend thyroid US. Note: No imaging follow-up is recommended for patients who have limited life expectancy or significant co-morbidities, unless clinically warranted. This recommendation does not apply to patients with symptomatic thyroid disease or increased risk for thyroid cancer. Reference: Earlyne Iba ESPQZR 0076 AUQ;33(3): 143-50 Lungs/Pleura: Major airways are patent. Widely scattered nodular and confluent  peribronchial opacity, mostly sub solid ground-glass opacity. Most confluent involvement in both lower lobes, where there is also a degree of superimposed enhancing lung atelectasis. No pleural effusion. No pneumothorax. Upper Abdomen: Hepatic steatosis. Negative visible gallbladder. Early splenic enhancement is within normal limits. Negative visible pancreas, adrenal glands and bowel. Musculoskeletal: Age advanced lower thoracic disc and endplate degeneration. No acute osseous abnormality identified. Review of the MIP images confirms the above findings. IMPRESSION: 1. Suboptimal contrast bolus timing and respiratory motion limits the exam. No pulmonary embolus is identified. 2. Widespread bilateral lung opacity typical for COVID-19 pneumonia. No pneumothorax or complicating features. 3. Hepatic steatosis. 4. A 2.3 cm right thyroid nodule meets consensus criteria for routine outpatient follow-up. Recommend Thyroid Ultrasound. Reference: Alba Destine Coll Radiol 5456 YBW;38(9): 143-50. Electronically Signed   By: Odessa Fleming M.D.   On: 12/01/2020 06:00    ____________________________________________   PROCEDURES  Procedure(s) performed (including Critical Care):  Procedures   ____________________________________________   INITIAL IMPRESSION / ASSESSMENT AND PLAN / ED COURSE  As part of my medical decision making, I reviewed the following data within the electronic MEDICAL RECORD NUMBER Nursing notes reviewed and incorporated, Labs reviewed, EKG interpreted, Old chart reviewed, Radiograph reviewed and Notes from prior ED visits     30 year old COVID-positive female presenting with body aches, cough, shortness of breath, posttussive emesis, diarrhea. Differential includes, but is not limited to, viral syndrome, bronchitis including COPD exacerbation, pneumonia, reactive airway disease including asthma, CHF including exacerbation with or without pulmonary/interstitial edema, pneumothorax, ACS, thoracic trauma, and  pulmonary embolism.  Laboratory results unremarkable including 2 sets of negative troponins. Patient is tachycardic; will repeat temperature, perform ambulatory trial.  Clinical Course as of 12/01/20 0649  Tue Dec 01, 2020  0454 Patient afebrile. Heart rate improving. Will initiate IV fluid hydration, prednisone, azithromycin, Toradol for body aches, Zofran for nausea. Will obtain CTA chest to evaluate for PE. Perform ambulatory trial and room air saturations maintained at 94%. She was not extremely tachypneic nor tachycardic. [JS]  [id number] Updated patient on CT scan results.  She is feeling significantly better.  Tachycardia better.  Will discharge home on prednisone, azithromycin, Tussionex.  She already has a albuterol inhaler.  Will refer patient to outpatient COVID treatment center for possible treatment.  Strict return precautions given.  Patient verbalizes understanding agrees with plan of care. [JS]    Clinical Course User Index [JS] Irean Hong, MD     ____________________________________________   FINAL CLINICAL IMPRESSION(S) / ED DIAGNOSES  Final diagnoses:  Pneumonia due to COVID-19 virus     ED Discharge Orders         Ordered    predniSONE (DELTASONE) 50 MG tablet        12/01/20 3734    chlorpheniramine-HYDROcodone (TUSSIONEX PENNKINETIC ER)  10-8 MG/5ML SUER  2 times daily        12/01/20 0638    azithromycin (ZITHROMAX) 250 MG tablet  Daily        12/01/20 [id number]    Ambulatory referral for Covid Treatment        12/01/20 [id number]          *Please note:  Athalene S Dorinda HillDonald was evaluated in Emergency Department on 12/01/2020 for the symptoms described in the history of present illness. She was evaluated in the context of the global COVID-19 pandemic, which necessitated consideration that the patient might be at risk for infection with the SARS-CoV-2 virus that causes COVID-19. Institutional protocols and algorithms that pertain to the evaluation of patients at risk for  COVID-19 are in a state of rapid change based on information released by regulatory bodies including the CDC and federal and state organizations. These policies and algorithms were followed during the patient's care in the ED.  Some ED evaluations and interventions may be delayed as a result of limited staffing during and the pandemic.*   Note:  This document was prepared using Dragon voice recognition software and may include unintentional dictation errors.   Irean HongSung, Nakyiah Kuck J, MD 12/01/20 [phone]

## 2020-12-01 NOTE — Discharge Instructions (Signed)
Take steroid and antibiotic daily for the next 4 days: Prednisone 50 mg Azithromycin 250 mg You may take cough medicine as needed (Tussionex). Use your albuterol inhaler 2 puffs every 4 hours as needed for difficulty breathing. Return to the ER for worsening symptoms, persistent vomiting, difficulty breathing or other concerns.

## 2020-12-01 NOTE — ED Notes (Signed)
This RN ambulated pt with pulse oximetry, O2 saturation remained 92%-94% on room air. Dolores Frame MD made aware.

## 2021-01-01 ENCOUNTER — Other Ambulatory Visit: Payer: Self-pay | Admitting: Family Medicine

## 2021-01-01 DIAGNOSIS — E041 Nontoxic single thyroid nodule: Secondary | ICD-10-CM

## 2021-01-13 ENCOUNTER — Other Ambulatory Visit: Payer: Self-pay

## 2021-01-13 ENCOUNTER — Ambulatory Visit
Admission: RE | Admit: 2021-01-13 | Discharge: 2021-01-13 | Disposition: A | Discharge status: Home / Self Care | Payer: Medicaid Other | Source: Ambulatory Visit | Attending: Family Medicine | Admitting: Family Medicine

## 2021-01-13 DIAGNOSIS — E041 Nontoxic single thyroid nodule: Secondary | ICD-10-CM | POA: Diagnosis not present

## 2021-06-14 NOTE — Progress Notes (Signed)
Patient on schedule for LP 06/17/2021, called and spoke with patient on phone with pre procedure instructions given. Made aware to be here @0830 , and driver post procedure/recovery/discharge,aware to have mother/legal guardian with her for consent of procedure. Stated understanding.

## 2021-06-17 ENCOUNTER — Other Ambulatory Visit: Payer: Self-pay

## 2021-06-17 ENCOUNTER — Ambulatory Visit
Admission: RE | Admit: 2021-06-17 | Discharge: 2021-06-17 | Disposition: A | Discharge status: Home / Self Care | Payer: Medicaid Other | Source: Ambulatory Visit | Attending: Neurology | Admitting: Neurology

## 2021-06-17 DIAGNOSIS — G932 Benign intracranial hypertension: Secondary | ICD-10-CM | POA: Diagnosis present

## 2021-06-17 DIAGNOSIS — R519 Headache, unspecified: Secondary | ICD-10-CM | POA: Diagnosis present

## 2021-06-17 DIAGNOSIS — H4710 Unspecified papilledema: Secondary | ICD-10-CM | POA: Insufficient documentation

## 2021-06-17 LAB — PREGNANCY, URINE: Preg Test, Ur: NEGATIVE

## 2021-06-17 MED ORDER — ACETAMINOPHEN 500 MG PO TABS
500.0000 mg | ORAL_TABLET | Freq: Four times a day (QID) | ORAL | Status: DC | PRN
  Filled 2021-06-17: qty 1

## 2021-06-17 MED ORDER — LIDOCAINE HCL 1 % IJ SOLN
5.0000 mL | Freq: Once | INTRAMUSCULAR | Status: DC
  Filled 2021-06-17: qty 5

## 2021-06-17 MED ORDER — ACETAMINOPHEN 500 MG PO TABS
ORAL_TABLET | ORAL | Status: AC
  Administered 2021-06-17: 500 mg
  Filled 2021-06-17: qty 1

## 2021-06-17 NOTE — Progress Notes (Signed)
Pt stable after LP.Back stable. D/C instructions given.F/U with her MD.

## 2021-06-17 NOTE — Progress Notes (Signed)
Patient here for procedure there is an active fyi on chart patient has legal guardian but does not indicate who the legal guardian is nor is there any scanned documentation of any legal guardianship in her chart.  I see that in the past the patient has signed her consent forms.  I asked the patient and her mother about this and I was told the patient is unable to read/write.  I asked about the prior consent forms and the mother said she signs the daughters name on the consents.  I explained that the consent form could be read to her and she could make a mark on the form.  The mother said she signs for her and I explained legally the mother would sign her name and indicate her relationship to the patient and also the reason for her signing on the consents.  I also asked the mother to bring in documentation of her legal guardianship so that this could be noted in the patient's chart.

## 2021-11-23 ENCOUNTER — Other Ambulatory Visit: Payer: Self-pay | Admitting: Otolaryngology

## 2021-11-23 DIAGNOSIS — H90A32 Mixed conductive and sensorineural hearing loss, unilateral, left ear with restricted hearing on the contralateral side: Secondary | ICD-10-CM

## 2021-12-14 ENCOUNTER — Ambulatory Visit: Payer: Medicaid Other

## 2021-12-17 ENCOUNTER — Ambulatory Visit
Admission: RE | Admit: 2021-12-17 | Discharge: 2021-12-17 | Disposition: A | Discharge status: Home / Self Care | Payer: Medicaid Other | Source: Ambulatory Visit | Attending: Otolaryngology | Admitting: Otolaryngology

## 2021-12-17 DIAGNOSIS — H90A32 Mixed conductive and sensorineural hearing loss, unilateral, left ear with restricted hearing on the contralateral side: Secondary | ICD-10-CM | POA: Insufficient documentation

## 2022-06-30 ENCOUNTER — Emergency Department
Admission: EM | Admit: 2022-06-30 | Discharge: 2022-06-30 | Disposition: A | Discharge status: Home / Self Care | Payer: Medicaid Other | Attending: Emergency Medicine | Admitting: Emergency Medicine

## 2022-06-30 ENCOUNTER — Emergency Department: Payer: Medicaid Other

## 2022-06-30 ENCOUNTER — Other Ambulatory Visit: Payer: Self-pay

## 2022-06-30 DIAGNOSIS — M5432 Sciatica, left side: Secondary | ICD-10-CM | POA: Diagnosis not present

## 2022-06-30 DIAGNOSIS — M545 Low back pain, unspecified: Secondary | ICD-10-CM | POA: Diagnosis present

## 2022-06-30 LAB — COMPREHENSIVE METABOLIC PANEL
ALT: 17 U/L (ref 0–44)
AST: 18 U/L (ref 15–41)
Albumin: 3.7 g/dL (ref 3.5–5.0)
Alkaline Phosphatase: 87 U/L (ref 38–126)
Anion gap: 8 (ref 5–15)
BUN: 12 mg/dL (ref 6–20)
CO2: 24 mmol/L (ref 22–32)
Calcium: 8.8 mg/dL — ABNORMAL LOW (ref 8.9–10.3)
Chloride: 106 mmol/L (ref 98–111)
Creatinine, Ser: 0.74 mg/dL (ref 0.44–1.00)
GFR, Estimated: >60 mL/min (ref >60)
Glucose, Bld: 120 mg/dL — ABNORMAL HIGH (ref 70–99)
Potassium: 4.2 mmol/L (ref 3.5–5.1)
Sodium: 138 mmol/L (ref 135–145)
Total Bilirubin: 0.9 mg/dL (ref 0.3–1.2)
Total Protein: 7.8 g/dL (ref 6.5–8.1)

## 2022-06-30 LAB — CBC
HCT: 46.9 % — ABNORMAL HIGH (ref 36.0–46.0)
Hemoglobin: 13.1 g/dL (ref 12.0–15.0)
MCH: 19.5 pg — ABNORMAL LOW (ref 26.0–34.0)
MCHC: 27.9 g/dL — ABNORMAL LOW (ref 30.0–36.0)
MCV: 69.9 fL — ABNORMAL LOW (ref 80.0–100.0)
Platelets: 314 10*3/uL (ref 150–400)
RBC: 6.71 MIL/uL — ABNORMAL HIGH (ref 3.87–5.11)
RDW: 20 % — ABNORMAL HIGH (ref 11.5–15.5)
WBC: 8.3 10*3/uL (ref 4.0–10.5)
nRBC: 0 % (ref 0.0–0.2)

## 2022-06-30 LAB — URINALYSIS, ROUTINE W REFLEX MICROSCOPIC
Bilirubin Urine: NEGATIVE
Glucose, UA: NEGATIVE mg/dL
Ketones, ur: NEGATIVE mg/dL
Leukocytes,Ua: NEGATIVE
Nitrite: NEGATIVE
Protein, ur: 30 mg/dL — AB
Specific Gravity, Urine: 1.023 (ref 1.005–1.030)
pH: 7 (ref 5.0–8.0)

## 2022-06-30 LAB — LIPASE, BLOOD: Lipase: 36 U/L (ref 11–51)

## 2022-06-30 MED ORDER — OXYCODONE-ACETAMINOPHEN 5-325 MG PO TABS
2.0000 | ORAL_TABLET | Freq: Once | ORAL | Status: AC
  Administered 2022-06-30: 2 via ORAL
  Filled 2022-06-30: qty 2

## 2022-06-30 MED ORDER — ONDANSETRON 4 MG PO TBDP
4.0000 mg | ORAL_TABLET | Freq: Once | ORAL | Status: AC
  Administered 2022-06-30: 4 mg via ORAL
  Filled 2022-06-30: qty 1

## 2022-06-30 MED ORDER — OXYCODONE-ACETAMINOPHEN 5-325 MG PO TABS: 1.0000 | ORAL_TABLET | ORAL | 0 refills | Status: AC | PRN

## 2022-06-30 NOTE — ED Triage Notes (Signed)
Pt comes with c/o left leg pain, sciatica and belly pain. Pt states since having birth control removed from arm this all started. Pt states periods have been irregular. Pt states pain is unbearable.

## 2022-06-30 NOTE — ED Provider Notes (Signed)
[facility] Provider Note    Event Date/Time   First MD Initiated Contact with Patient 06/30/22 1159     (approximate)  History   Chief Complaint: Abdominal Pain and Leg Pain  HPI  Jakeia S Kirshenbaum is a 31 y.o. female past medical history of anemia, anxiety, migraines, obesity, presents to the emergency department for lower back pain and left leg pain.  According to the patient for the past year she has been experiencing pain in the lower back with numbness and burning pain radiating down the left leg.  Patient states she was referred to orthopedics was seen by [facility] and had an MRI scheduled however due to travel difficulty they did not make that appointment.  She states over the past 2 to 3 weeks she has had worsening pain so they came back to the emergency department today for evaluation.  Patient states a numbness sensation going down the lateral aspect of her left leg into her fourth and fifth toes of the left foot.  Appears to be neurovascularly intact right now but does have pain with range of motion of the left leg.  States pain mostly in the back with range of motion of the left leg.  As a secondary complaint patient states she had Nexplanon removed from her left arm approximately 1 year ago and noted that the back pain started shortly after this.  Physical Exam   Triage Vital Signs: ED Triage Vitals  Enc Vitals Group     BP 06/30/22 1022 126/88     Pulse Rate 06/30/22 1022 (!) 116     Resp 06/30/22 1022 18     Temp 06/30/22 1022 98 F (36.7 C)     Temp src --      SpO2 06/30/22 1022 97 %     Weight --      Height --      Head Circumference --      Peak Flow --      Pain Score 06/30/22 1020 10     Pain Loc --      Pain Edu? --      Excl. in GC? --     Most recent vital signs: Vitals:   06/30/22 1022  BP: 126/88  Pulse: (!) 116  Resp: 18  Temp: 98 F (36.7 C)  SpO2: 97%    General: Awake, no distress.  CV:  Good peripheral  perfusion.  Regular rate and rhythm  Resp:  Normal effort.  Equal breath sounds bilaterally.  Abd:  No distention.  Soft, nontender.  Obese. Other:  Patient has pain with range of motion of the left lower extremity.   ED Results / Procedures / Treatments   RADIOLOGY  MRI of the lumbar spine shows disc protrusion at L5/S1 compressing the left S1 nerve root.   MEDICATIONS ORDERED IN ED: Medications  oxyCODONE-acetaminophen (PERCOCET/ROXICET) 5-325 MG per tablet 2 tablet (2 tablets Oral Given 06/30/22 1225)  ondansetron (ZOFRAN-ODT) disintegrating tablet 4 mg (4 mg Oral Given 06/30/22 1225)     IMPRESSION / MDM / ASSESSMENT AND PLAN / ED COURSE  I reviewed the triage vital signs and the nursing notes.  Patient's presentation is most consistent with acute presentation with potential threat to life or bodily function.  Patient presents emergency department for worsening back pain now radiating into the left leg.  States symptoms have been ongoing for the past 1 year but have been getting worse.  States at least 1 year  of numbness to the left leg/foot but again states symptoms seem to be getting worse.  Patient's work-up in the emergency department is reassuring, lab work is largely within normal limits, urinalysis is normal, CBC and chemistry show no concerning findings.  Patient denies any trauma.  However given the numbness sensation and worsening back pain we will obtain an MRI of the lower back to further evaluate.  We will treat with Percocet in the emergency department.  Patient just finished a 2-week prednisone taper approximately 1 week ago but states no reduction in low back pain.  We will refer back to [facility] as well.  Patient agreeable to plan of care.  MRI shows S1 nerve root compression, likely the cause of the patient's chronic left leg pain with a recently exacerbated.  Patient is just finished a course of steroids.  Discussed with the patient she needs to follow-up with  [facility] with whom she has been seen.  We will prescribe pain medication for the patient.  Patient agreeable to plan of care.  Provided return precautions.  FINAL CLINICAL IMPRESSION(S) / ED DIAGNOSES   Low back pain Sciatica  document was prepared using Dragon voice recognition software and may include unintentional dictation errors.   Minna Antis, MD 06/30/22 1348

## 2023-03-26 IMAGING — CT CT TEMPORAL BONES W/O CM
3 of 7 series · 15 of 40 positions shown, 18 images · non-contrast
Comparison: MRI head 08/18/2020

CLINICAL DATA: Bilateral ear pain.

EXAM:
CT TEMPORAL BONES WITHOUT CONTRAST
TECHNIQUE: Axial and coronal plane CT imaging of the petrous temporal bones was
performed with thin-collimation image reconstruction. No intravenous
contrast was administered. Multiplanar CT image reconstructions were
also generated.
RADIATION DOSE REDUCTION: This exam was performed according to the
departmental dose-optimization program which includes automated
exposure control, adjustment of the mA and/or kV according to
patient size and/or use of iterative reconstruction technique.

[Series 3: ax soft · axial · 0.38mm/px · z∈[-87,-49]mm · 4 of 39 slices shown]
[im 7/39  brain]
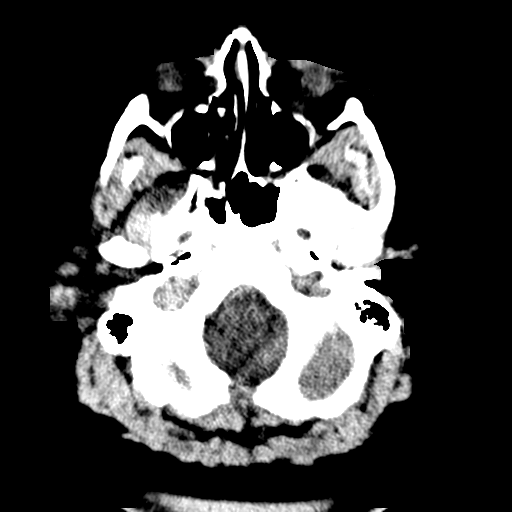
[im 13/39  brain]
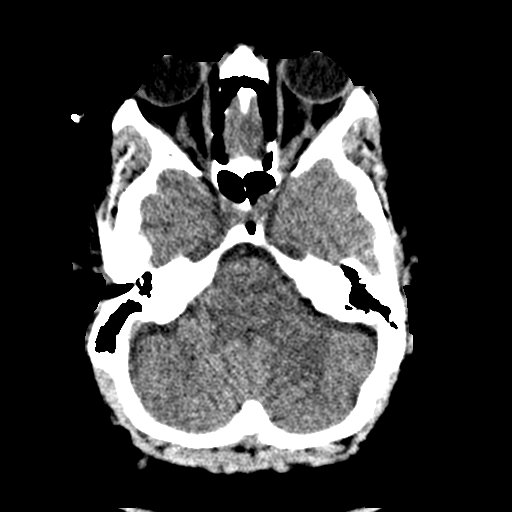
[im 20/39  brain]
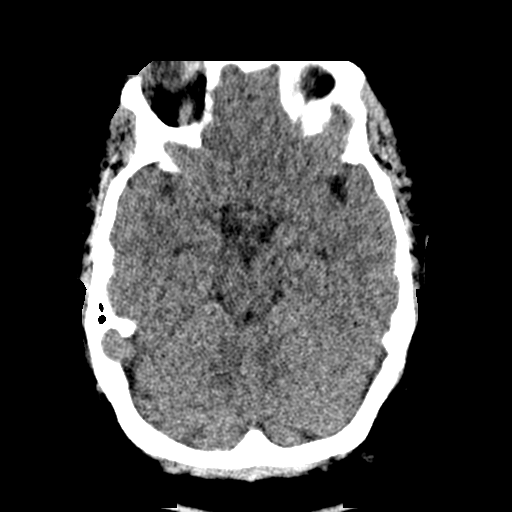
[im 26/39  brain]
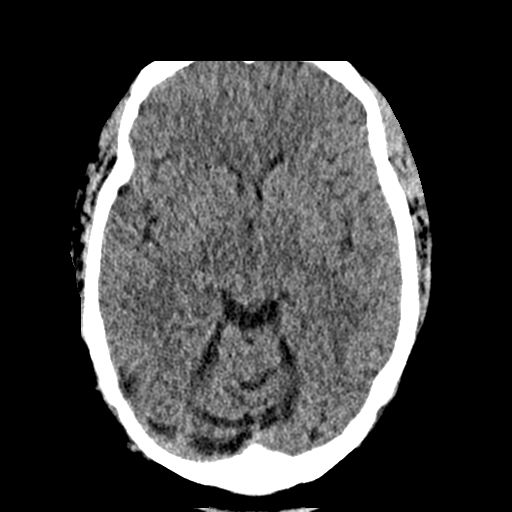

[Series 4: coronal bone. · coronal · 0.17mm/px · 2 of 231 slices shown]
[im 77/231  bone]
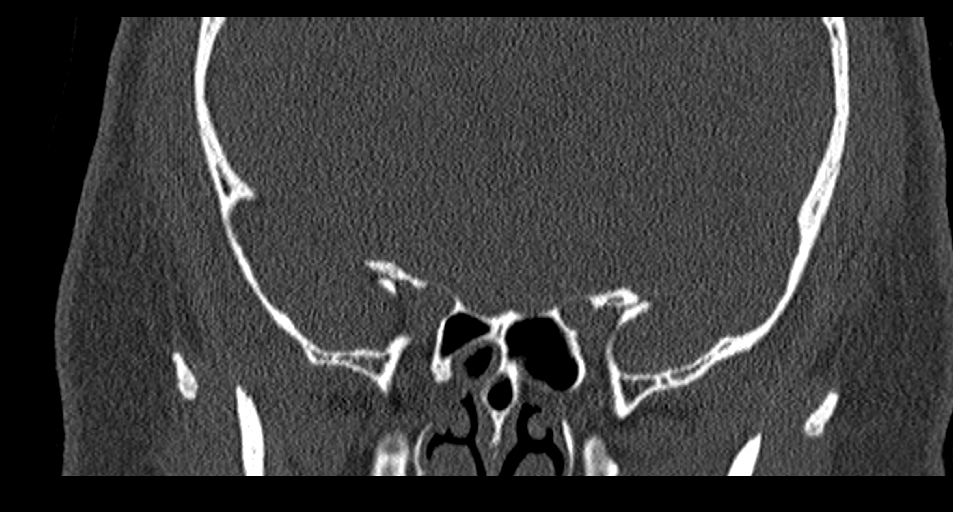
[im 154/231  bone]
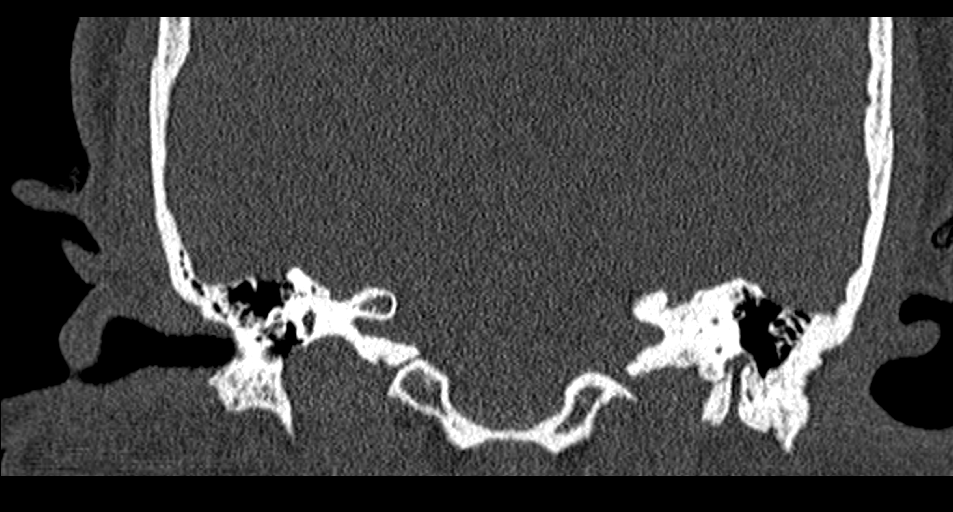

[Series 5: ax mag right · axial · 0.20mm/px · z∈[-95,-62]mm · 9 of 71 slices shown, 12 images]
[im 8/71  brain]
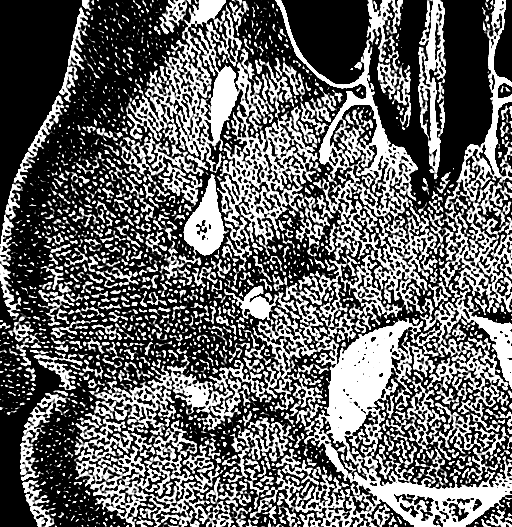
[im 8/71  bone]
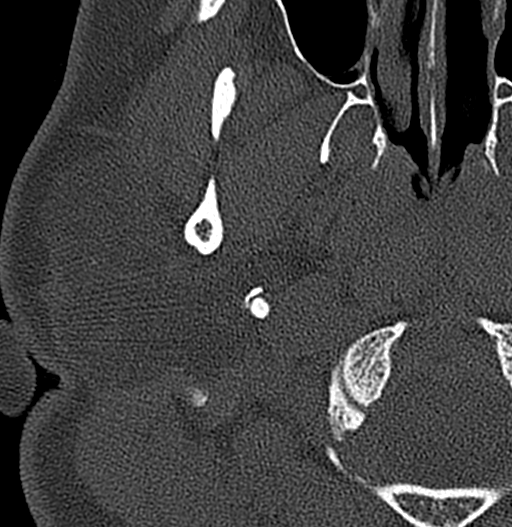
[im 15/71  bone]
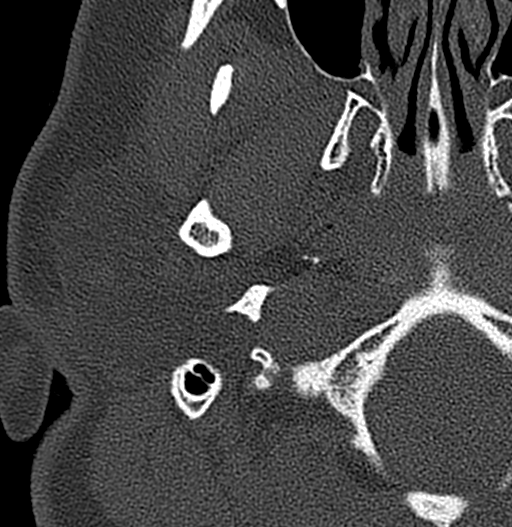
[im 22/71  bone]
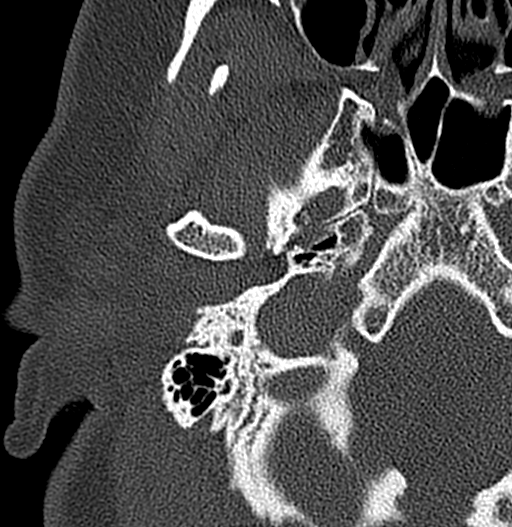
[im 29/71  bone]
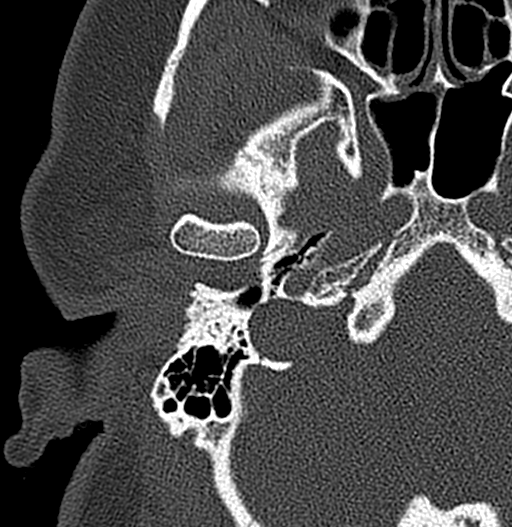
[im 36/71  brain]
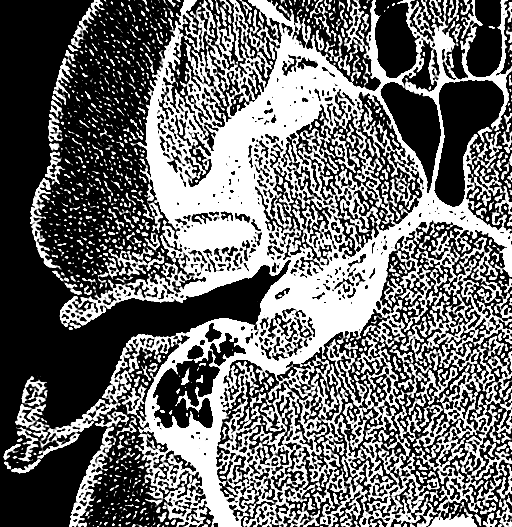
[im 36/71  bone]
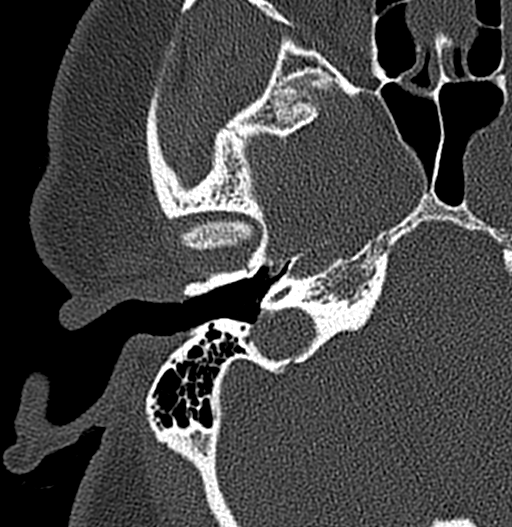
[im 43/71  bone]
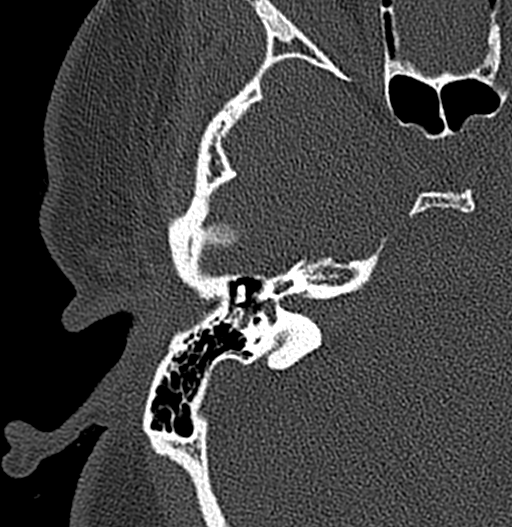
[im 50/71  bone]
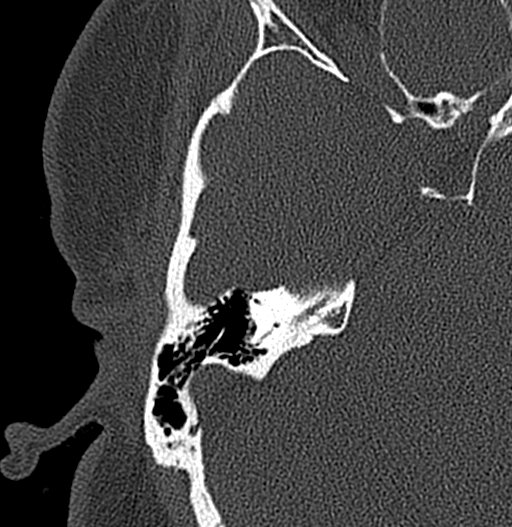
[im 57/71  bone]
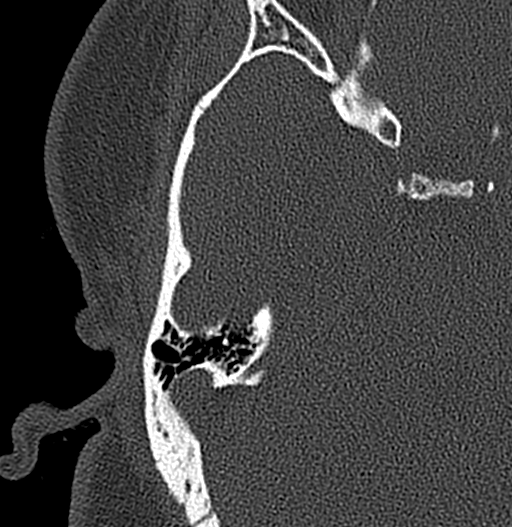
[im 64/71  brain]
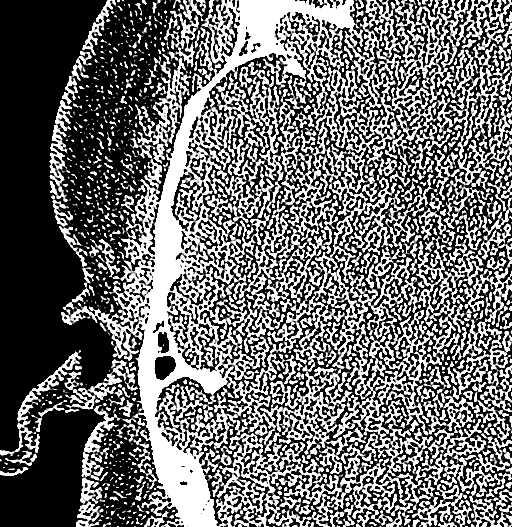
[im 64/71  bone]
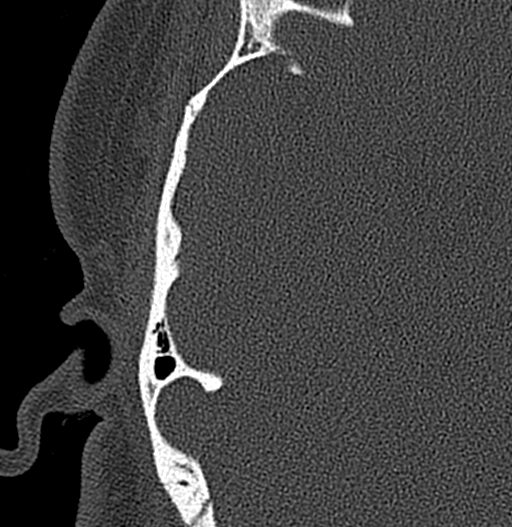

[15 of 40 positions shown; findings below may reference images not displayed]

FINDINGS: Both external auditory canals are widely patent and normal. Both
tympanic membranes are intact. Both ossicular chains appear normal.
No fluid in either middle ear or attic. Mastoid air cells are normal
and clear on both sides. Inner ear structures are normal on both
sides. No evidence of regional soft tissue pathology of an acute
nature.

Visualized paranasal sinuses are clear. Visualized intracranial
contents are normal.
IMPRESSION: Normal temporal bone CT. No abnormality seen to explain bilateral
ear pain.

## 2024-02-21 ENCOUNTER — Other Ambulatory Visit: Payer: Self-pay | Admitting: Otolaryngology

## 2024-02-21 DIAGNOSIS — J320 Chronic maxillary sinusitis: Secondary | ICD-10-CM

## 2024-02-26 ENCOUNTER — Encounter: Payer: Self-pay | Admitting: Otolaryngology

## 2024-03-01 ENCOUNTER — Ambulatory Visit
Admission: RE | Admit: 2024-03-01 | Discharge: 2024-03-01 | Disposition: A | Discharge status: Home / Self Care | Source: Ambulatory Visit | Attending: Otolaryngology | Admitting: Otolaryngology

## 2024-03-01 DIAGNOSIS — J320 Chronic maxillary sinusitis: Secondary | ICD-10-CM
